# Patient Record
Sex: Male | Born: 1950 | Hispanic: No | Marital: Married | Smoking: Former smoker
Health system: Southern US, Community
[De-identification: ages and names within clinical notes are randomized; demographics above are authoritative.]

## PROBLEM LIST (undated history)

## (undated) DIAGNOSIS — E785 Hyperlipidemia, unspecified: Secondary | ICD-10-CM

## (undated) DIAGNOSIS — E1165 Type 2 diabetes mellitus with hyperglycemia: Secondary | ICD-10-CM

## (undated) DIAGNOSIS — I219 Acute myocardial infarction, unspecified: Secondary | ICD-10-CM

## (undated) DIAGNOSIS — E538 Deficiency of other specified B group vitamins: Secondary | ICD-10-CM

## (undated) DIAGNOSIS — E559 Vitamin D deficiency, unspecified: Secondary | ICD-10-CM

## (undated) DIAGNOSIS — N400 Enlarged prostate without lower urinary tract symptoms: Secondary | ICD-10-CM

## (undated) DIAGNOSIS — I10 Essential (primary) hypertension: Secondary | ICD-10-CM

## (undated) DIAGNOSIS — I2510 Atherosclerotic heart disease of native coronary artery without angina pectoris: Secondary | ICD-10-CM

## (undated) HISTORY — PX: CORONARY ANGIOPLASTY WITH STENT PLACEMENT: SHX49

## (undated) HISTORY — DX: Essential (primary) hypertension: I10

## (undated) HISTORY — DX: Hyperlipidemia, unspecified: E78.5

## (undated) HISTORY — DX: Atherosclerotic heart disease of native coronary artery without angina pectoris: I25.10

## (undated) HISTORY — PX: OTHER SURGICAL HISTORY: SHX169

## (undated) HISTORY — DX: Deficiency of other specified B group vitamins: E53.8

## (undated) HISTORY — DX: Vitamin D deficiency, unspecified: E55.9

## (undated) HISTORY — DX: Acute myocardial infarction, unspecified: I21.9

## (undated) HISTORY — DX: Type 2 diabetes mellitus with hyperglycemia: E11.65

## (undated) HISTORY — DX: Benign prostatic hyperplasia without lower urinary tract symptoms: N40.0

---

## 2018-11-07 ENCOUNTER — Encounter: Payer: Self-pay | Admitting: Gastroenterology

## 2018-11-09 ENCOUNTER — Encounter: Payer: Self-pay | Admitting: Gastroenterology

## 2018-11-21 ENCOUNTER — Encounter: Payer: Self-pay | Admitting: Gastroenterology

## 2018-12-05 ENCOUNTER — Ambulatory Visit: Payer: Medicaid Other | Admitting: Gastroenterology

## 2018-12-05 ENCOUNTER — Encounter: Payer: Self-pay | Admitting: Gastroenterology

## 2018-12-05 ENCOUNTER — Other Ambulatory Visit: Payer: Self-pay

## 2018-12-05 VITALS — BP 124/80 | HR 85 | Temp 98.0°F | Ht 64.0 in | Wt 158.0 lb

## 2018-12-05 DIAGNOSIS — R634 Abnormal weight loss: Secondary | ICD-10-CM | POA: Diagnosis not present

## 2018-12-05 DIAGNOSIS — R1084 Generalized abdominal pain: Secondary | ICD-10-CM | POA: Diagnosis not present

## 2018-12-05 DIAGNOSIS — K5900 Constipation, unspecified: Secondary | ICD-10-CM

## 2018-12-05 DIAGNOSIS — R630 Anorexia: Secondary | ICD-10-CM | POA: Diagnosis not present

## 2018-12-05 MED ORDER — NA SULFATE-K SULFATE-MG SULF 17.5-3.13-1.6 GM/177ML PO SOLN: 1.0000 | Freq: Once | ORAL | 0 refills | Status: AC

## 2018-12-05 NOTE — Patient Instructions (Addendum)
If you are age 70 or older, your body mass index should be between 23-30. Your Body mass index is 27.12 kg/m. If this is out of the aforementioned range listed, please consider follow up with your Primary Care Provider.  If you are age 79 or younger, your body mass index should be between 19-25. Your Body mass index is 27.12 kg/m. If this is out of the aformentioned range listed, please consider follow up with your Primary Care Provider.   To help prevent the possible spread of infection to our patients, communities, and staff; we will be implementing the following measures:  As of now we are not allowing any visitors/family members to accompany you to any upcoming appointments with [facility]. If you have any concerns about this please contact our office to discuss prior to the appointment.   You have been scheduled [facility] CT [facility] the [facility] [place] [address] [address] [facility]   You are scheduled on 12/07/2018 at 10:00am. You should arrive 15 minutes prior to your appointment time for registration. Please follow the written instructions below on the day of your exam:  WARNING: IF YOU ARE ALLERGIC TO IODINE/X-RAY DYE, PLEASE NOTIFY RADIOLOGY IMMEDIATELY AT [phone]! YOU WILL BE GIVEN A 13 HOUR PREMEDICATION PREP.  1) Do not eat or drink anything after 6:00am (4 hours prior to your test) 2) You have been given 2 bottles of oral contrast to drink. The solution may taste better if refrigerated, but do NOT add ice or any other liquid to this solution. Shake well before drinking.    Drink 1 bottle of contrast @ 8:00am (2 hours prior to your exam)  Drink 1 bottle of contrast @ 9:00am (1 hour prior to your exam)  You may take any medications as prescribed with a small amount of water, if necessary. If you take any of the following medications: METFORMIN, GLUCOPHAGE, GLUCOVANCE, AVANDAMET, RIOMET, FORTAMET, [place]  MET, JANUMET, GLUMETZA or METAGLIP, you MAY be asked to HOLD this medication 48 hours AFTER the exam.  The purpose of you drinking the oral contrast is to aid in the visualization of your intestinal tract. The contrast solution may cause some diarrhea. Depending on your individual set of symptoms, you may also receive an intravenous injection of x-ray contrast/dye. Plan on being at [facility] for 30 minutes or longer, depending on the type of exam you are having performed.  This test typically takes 30-45 minutes to complete.  If you have any questions regarding your exam or if you need to reschedule, you may call the CT department at [phone] between the hours of 8:00 am and 5:00 pm, Monday-Friday.  ________________________________________________________________________  Ralph Mosley have been scheduled for an endoscopy and colonoscopy. Please follow the written instructions given to you at your visit today. Please pick up your prep supplies at the pharmacy within the next 1-3 days. If you use inhalers (even only as needed), please bring them with you on the day of your procedure. Your physician has requested that you go to www.startemmi.com and enter the access code given to you at your visit today. This web site gives a general overview about your procedure. However, you should still follow specific instructions given to you by our office regarding your preparation for the procedure.  We have sent the following medications to your pharmacy for you to pick up at your convenience: Suprep  Please purchase the following medications over the  counter and take as directed: Miralax 17g mixed with 8 ox of water daily.  Thank you,  Dr. Jackquline Denmark

## 2018-12-05 NOTE — Progress Notes (Signed)
Chief Complaint: Abdominal pain  Referring Provider:  Dr Jannette Fogo      ASSESSMENT AND PLAN;   #1. Abd pain #2. Anorexia.  Has worsening CKD. Being followed by Dr Audie Clear. R/O GI causes.  Could also be because of medications. #3. Wt Loss #4. Constipation. #5.  Comorbid conditions include CAD, DM2, CKD3, HTN, HLD,   Plan: - Blood tests results from Dr Jannette Fogo. - CT abdomen/pelvis with PO contrast only (No IV contrast d/t CRI). - Proceed with EGD/colon off plavix 5 days after cardio clearance at [facility]. Discussed risks & benefits. Patient and patient's son agrees to proceed. All the questions were answered. Consent forms given for review.  It will be a good opportunity to screen him for colorectal cancer screenings as well. - If continues to have upper GI symptoms and the above work-up is negative, proceed with solid-phase GES to r/o diabetic gastroparesis. - Miralax 17g po qd for now. - Can stop Linzess as it is not working. - For now we will continue Protonix 40 mg p.o. once a day.  May reduce dose after EGD. - FU therafter. - D/W patient and patient's son in detail.    HPI:    Ralph Mosley is a 68 y.o. male  Accompanied by his son With longstanding history of chronic abdominal pain x several years, getting worse lately.  More or less generalized.  More with fried foods. Has associated mild nausea and more profound anorexia with questionable early satiety.  The GI symptoms are getting worse.  No odynophagia or dysphagia.  Has history of longstanding constipation with passage of pellet-like stools.  At times has to strain.  Associated with abdominal bloating.  Apparently had CT scan several years ago which showed gastric wall thickening.  Has lost approximately 10 pounds over the last 3 months.  No weight loss.  Also history of anemia in the past.  Had blood work done by Dr. Jannette Fogo few days ago.  I do not have the lab results at the present time.   Past Medical History:   Diagnosis Date  . CAD (coronary artery disease)   . HTN (hypertension)   . Hyperlipidemia   . Hyperplasia of prostate without lower urinary tract symptoms (LUTS)   . Type 2 diabetes mellitus with hyperglycemia ([place])   . Vitamin B12 deficiency   . Vitamin D deficiency     Past Surgical History:  Procedure Laterality Date  . Cardiac Catherization    . CORONARY ANGIOPLASTY WITH STENT PLACEMENT      Family History  Problem Relation Age of Onset  . Colon cancer Neg Hx   . Esophageal cancer Neg Hx     Social History   Tobacco Use  . Smoking status: Former Research scientist (life sciences)  . Smokeless tobacco: Never Used  Substance Use Topics  . Alcohol use: Not Currently  . Drug use: Not Currently    Current Outpatient Medications  Medication Sig Dispense Refill  . clopidogrel (PLAVIX) 75 MG tablet Take 75 mg by mouth daily.    Marland Kitchen glipiZIDE (GLUCOTROL XL) 10 MG 24 hr tablet Take 1 tablet by mouth daily.    Marland Kitchen linaclotide (LINZESS) 145 MCG CAPS capsule Take 145 mcg by mouth as needed.    . nitroGLYCERIN (NITROSTAT) 0.4 MG SL tablet Place under the tongue as needed.    . OSENI 25-15 MG TABS Take 1 tablet by mouth daily.    . rosuvastatin (CRESTOR) 20 MG tablet Take 20 mg by mouth daily.    Marland Kitchen  TRULICITY [id number] SOPN once a week.    . Vitamin D, Ergocalciferol, (DRISDOL) 1.25 MG (50000 UT) CAPS capsule 1 capsule. 2 times a week    . omeprazole (PRILOSEC) 20 MG capsule Take by mouth daily.     No current facility-administered medications for this visit.     Not on File  Review of Systems:  Constitutional: Denies fever, chills, diaphoresis, appetite change and fatigue.  HEENT: Denies photophobia, eye pain, redness, hearing loss, ear pain, congestion, sore throat, rhinorrhea, sneezing, mouth sores, neck pain, neck stiffness and tinnitus.   Respiratory: Denies SOB, DOE, cough, chest tightness,  and wheezing.   Cardiovascular: Denies chest pain, palpitations and leg swelling.  Genitourinary: Denies  dysuria, urgency, frequency, hematuria, flank pain and difficulty urinating.  Musculoskeletal: Denies myalgias, back pain, joint swelling, arthralgias and gait problem.  Skin: No rash.  Neurological: Denies dizziness, seizures, syncope, weakness, light-headedness, numbness and headaches.  Hematological: Denies adenopathy. Easy bruising, personal or family bleeding history  Psychiatric/Behavioral: No anxiety or depression     Physical Exam:    BP 124/80   Pulse 85   Temp 98 F (36.7 C)   Ht 5\' 4"  (1.626 m)   Wt 158 lb (71.7 kg)   BMI 27.12 kg/m  Filed Weights   12/05/18 1105  Weight: 158 lb (71.7 kg)   Constitutional:  Well-developed, in no acute distress. Psychiatric: Normal mood and affect. Behavior is normal. HEENT: Pupils normal.  Conjunctivae are normal. No scleral icterus. Neck supple.  Cardiovascular: Normal rate, regular rhythm. No edema Pulmonary/chest: Effort normal and breath sounds normal. No wheezing, rales or rhonchi. Abdominal: Soft, nondistended. Nontender. Bowel sounds active throughout. There are no masses palpable. No hepatomegaly. Rectal:  defered Neurological: Alert and oriented to person place and time. Skin: Skin is warm and dry. No rashes noted.    Carmell Austria, MD 12/05/2018, 11:29 AM  Cc: Dr Jannette Fogo

## 2018-12-06 ENCOUNTER — Telehealth: Payer: Self-pay

## 2018-12-06 NOTE — Telephone Encounter (Signed)
Called and told patient patient that we received cardiac clearance and to stop Plavix 5 days prior to his procedure. Patient verbalized understanding

## 2018-12-07 ENCOUNTER — Other Ambulatory Visit: Payer: Self-pay

## 2018-12-07 ENCOUNTER — Ambulatory Visit (HOSPITAL_BASED_OUTPATIENT_CLINIC_OR_DEPARTMENT_OTHER)
Admission: RE | Admit: 2018-12-07 | Discharge: 2018-12-07 | Disposition: A | Discharge status: Home / Self Care | Payer: Medicaid Other | Source: Ambulatory Visit | Attending: Gastroenterology | Admitting: Gastroenterology

## 2018-12-07 DIAGNOSIS — R1084 Generalized abdominal pain: Secondary | ICD-10-CM | POA: Diagnosis not present

## 2018-12-07 DIAGNOSIS — R630 Anorexia: Secondary | ICD-10-CM | POA: Insufficient documentation

## 2018-12-07 DIAGNOSIS — K5900 Constipation, unspecified: Secondary | ICD-10-CM | POA: Diagnosis present

## 2018-12-07 DIAGNOSIS — R634 Abnormal weight loss: Secondary | ICD-10-CM | POA: Insufficient documentation

## 2019-01-04 ENCOUNTER — Telehealth: Payer: Self-pay | Admitting: Gastroenterology

## 2019-01-04 NOTE — Telephone Encounter (Signed)
Spoke to patient, answered NO to all Covid questions

## 2019-01-05 ENCOUNTER — Other Ambulatory Visit: Payer: Self-pay

## 2019-01-05 ENCOUNTER — Ambulatory Visit (AMBULATORY_SURGERY_CENTER): Payer: Medicaid Other | Admitting: Gastroenterology

## 2019-01-05 ENCOUNTER — Encounter: Payer: Self-pay | Admitting: Gastroenterology

## 2019-01-05 VITALS — BP 109/51 | HR 80 | Temp 97.6°F | Resp 12 | Ht 64.0 in | Wt 158.0 lb

## 2019-01-05 DIAGNOSIS — R630 Anorexia: Secondary | ICD-10-CM

## 2019-01-05 DIAGNOSIS — K3189 Other diseases of stomach and duodenum: Secondary | ICD-10-CM | POA: Diagnosis not present

## 2019-01-05 DIAGNOSIS — B9681 Helicobacter pylori [H. pylori] as the cause of diseases classified elsewhere: Secondary | ICD-10-CM

## 2019-01-05 DIAGNOSIS — R1084 Generalized abdominal pain: Secondary | ICD-10-CM

## 2019-01-05 DIAGNOSIS — D123 Benign neoplasm of transverse colon: Secondary | ICD-10-CM

## 2019-01-05 DIAGNOSIS — D122 Benign neoplasm of ascending colon: Secondary | ICD-10-CM | POA: Diagnosis not present

## 2019-01-05 DIAGNOSIS — K5730 Diverticulosis of large intestine without perforation or abscess without bleeding: Secondary | ICD-10-CM

## 2019-01-05 DIAGNOSIS — K648 Other hemorrhoids: Secondary | ICD-10-CM | POA: Diagnosis not present

## 2019-01-05 DIAGNOSIS — K269 Duodenal ulcer, unspecified as acute or chronic, without hemorrhage or perforation: Secondary | ICD-10-CM

## 2019-01-05 DIAGNOSIS — K2970 Gastritis, unspecified, without bleeding: Secondary | ICD-10-CM

## 2019-01-05 DIAGNOSIS — R634 Abnormal weight loss: Secondary | ICD-10-CM

## 2019-01-05 DIAGNOSIS — K259 Gastric ulcer, unspecified as acute or chronic, without hemorrhage or perforation: Secondary | ICD-10-CM | POA: Diagnosis not present

## 2019-01-05 MED ORDER — FAMOTIDINE 20 MG PO TABS: 20.0000 mg | ORAL_TABLET | Freq: Every evening | ORAL | 2 refills | Status: AC

## 2019-01-05 MED ORDER — SODIUM CHLORIDE 0.9 % IV SOLN: 500.0000 mL | INTRAVENOUS | Status: DC

## 2019-01-05 MED ORDER — PANTOPRAZOLE SODIUM 40 MG PO TBEC: 40.0000 mg | DELAYED_RELEASE_TABLET | Freq: Every day | ORAL | 3 refills | Status: AC

## 2019-01-05 NOTE — Patient Instructions (Signed)
Impression/Recommendations:  Gastritis handout given to patient. Diverticulosis handout given to patient. Hemorrhoid handout given to patient.  Resume previous diet. Continue present medications.  Increase Protonix 40 mg. By mouth 2 times daily for 6 weeks, then resume once a day.  Add Pepcid 20 mg. At night.  Resume Plavix (clopidogrel) at prior does in 7 days.  (October 3rd)  No aspirin, ibuprofen, naproxen, or other NSAID drugs.  Recommend repeating EGD in 12 weeks off Plavix.  Follow-up in GI clinic in 6-8 weeks.  YOU HAD AN ENDOSCOPIC PROCEDURE TODAY AT [facility]:   Refer to the procedure report that was given to you for any specific questions about what was found during the examination.  If the procedure report does not answer your questions, please call your gastroenterologist to clarify.  If you requested that your care partner not be given the details of your procedure findings, then the procedure report has been included in a sealed envelope for you to review at your convenience later.  YOU SHOULD EXPECT: Some feelings of bloating in the abdomen. Passage of more gas than usual.  Walking can help get rid of the air that was put into your GI tract during the procedure and reduce the bloating. If you had a lower endoscopy (such as a colonoscopy or flexible sigmoidoscopy) you may notice spotting of blood in your stool or on the toilet paper. If you underwent a bowel prep for your procedure, you may not have a normal bowel movement for a few days.  Please Note:  You might notice some irritation and congestion in your nose or some drainage.  This is from the oxygen used during your procedure.  There is no need for concern and it should clear up in a day or so.  SYMPTOMS TO REPORT IMMEDIATELY:   Following lower endoscopy (colonoscopy or flexible sigmoidoscopy):  Excessive amounts of blood in the stool  Significant tenderness or worsening of abdominal  pains  Swelling of the abdomen that is new, acute  Fever of 100F or higher   Following upper endoscopy (EGD)  Vomiting of blood or coffee ground material  New chest pain or pain under the shoulder blades  Painful or persistently difficult swallowing  New shortness of breath  Fever of 100F or higher  Black, tarry-looking stools  For urgent or emergent issues, a gastroenterologist can be reached at any hour by calling [phone].   DIET:  We do recommend a small meal at first, but then you may proceed to your regular diet.  Drink plenty of fluids but you should avoid alcoholic beverages for 24 hours.  ACTIVITY:  You should plan to take it easy for the rest of today and you should NOT DRIVE or use heavy machinery until tomorrow (because of the sedation medicines used during the test).    FOLLOW UP: Our staff will call the number listed on your records 48-72 hours following your procedure to check on you and address any questions or concerns that you may have regarding the information given to you following your procedure. If we do not reach you, we will leave a message.  We will attempt to reach you two times.  During this call, we will ask if you have developed any symptoms of COVID 19. If you develop any symptoms (ie: fever, flu-like symptoms, shortness of breath, cough etc.) before then, please call [phone].  If you test positive for Covid 19 in the 2 weeks post procedure, please call  and report this information to [place].    If any biopsies were taken you will be contacted by phone or by letter within the next 1-3 weeks.  Please call us at [phone] if you have not heard about the biopsies in 3 weeks.    SIGNATURES/CONFIDENTIALITY: You and/or your care partner have signed paperwork which will be entered into your electronic medical record.  These signatures attest to the fact that that the information above on your After Visit Summary has been reviewed and is understood.   Full responsibility of the confidentiality of this discharge information lies with you and/or your care-partner.

## 2019-01-05 NOTE — Progress Notes (Signed)
PT taken to PACU. Monitors in place. VSS. Report given to RN. 

## 2019-01-05 NOTE — Op Note (Addendum)
Ralph Mosley Patient Name: Ralph Mosley Procedure Date: 01/05/2019 8:02 AM MRN: [id number] Endoscopist: Jackquline Denmark , MD Age: 68 Referring MD:  Date of Birth: [birth year] Gender: Male Account #: [id number] Procedure:                Colonoscopy Indications:              Iron deficiency anemia Medicines:                Monitored Anesthesia Care Procedure:                Pre-Anesthesia Assessment:                           - Prior to the procedure, a History and Physical                            was performed, and patient medications and                            allergies were reviewed. The patient's tolerance of                            previous anesthesia was also reviewed. The risks                            and benefits of the procedure and the sedation                            options and risks were discussed with the patient.                            All questions were answered, and informed consent                            was obtained. Prior Anticoagulants: The patient has                            taken Plavix (clopidogrel), last dose was 5 days                            prior to procedure. ASA Grade Assessment: III - A                            patient with severe systemic disease. After                            reviewing the risks and benefits, the patient was                            deemed in satisfactory condition to undergo the                            procedure.  After obtaining informed consent, the colonoscope                            was passed under direct vision. Throughout the                            procedure, the patient's blood pressure, pulse, and                            oxygen saturations were monitored continuously. The                            LOANER 0255 was introduced through the anus and                            advanced to the 1 cm into the ileum. The   colonoscopy was performed without difficulty. The                            patient tolerated the procedure well. The quality                            of the bowel preparation was good. The ileocecal                            valve, appendiceal orifice, and rectum were                            photographed. Scope In: 8:16:04 AM Scope Out: 8:25:13 AM Scope Withdrawal Time: 0 hours 5 minutes 57 seconds  Total Procedure Duration: 0 hours 9 minutes 9 seconds  Findings:                 Three sessile polyps were found in the transverse                            colon and ascending colon. The polyps were 2 to 8                            mm in size. Larger polyps were removed with a cold                            snare, small one with cold biopsy forceps.                            Resection and retrieval were complete. Estimated                            blood loss: none.                           A few small-mouthed diverticula were found in the  sigmoid colon.                           Non-bleeding internal hemorrhoids were found during                            retroflexion. The hemorrhoids were moderate.                           The terminal ileum appeared normal.                           The exam was otherwise without abnormality. Complications:            No immediate complications. Estimated Blood Loss:     Estimated blood loss: none. Impression:               -Colonic polyps S/P polypectomy.                           -Minimal sigmoid diverticulosis.                           -Non-bleeding internal hemorrhoids.                           -Otherwise normal colonoscopy to TI. Recommendation:           - Patient has a contact number available for                            emergencies. The signs and symptoms of potential                            delayed complications were discussed with the                            patient. Return to normal  activities tomorrow.                            Written discharge instructions were provided to the                            patient.                           - Resume previous diet.                           - Continue present medications.                           - MiraLAX 17 g p.o. once a day if he gets                            constipated.                           - Resume Plavix (clopidogrel)  at prior dose as per                            EGD note.                           - Await pathology results.                           - Repeat colonoscopy for surveillance based on                            pathology results.                           - Return to GI clinic in 8 weeks. Jackquline Denmark, MD 01/05/2019 8:39:51 AM This report has been signed electronically.

## 2019-01-05 NOTE — Progress Notes (Signed)
Pt's states no medical or surgical changes since  office visit. 

## 2019-01-05 NOTE — Op Note (Signed)
[place] Patient Name: Ralph Mosley Procedure Date: 01/05/2019 8:02 AM MRN: [id number] Endoscopist: Jackquline Denmark , MD Age: 68 Referring MD:  Date of Birth: Apr 30, 1950 Gender: Male Account #: [id number] Procedure:                Upper GI endoscopy Indications:              Epigastric abdominal pain, anorexia Medicines:                Monitored Anesthesia Care Procedure:                Pre-Anesthesia Assessment:                           - Prior to the procedure, a History and Physical                            was performed, and patient medications and                            allergies were reviewed. The patient's tolerance of                            previous anesthesia was also reviewed. The risks                            and benefits of the procedure and the sedation                            options and risks were discussed with the patient.                            All questions were answered, and informed consent                            was obtained. Prior Anticoagulants: The patient has                            taken Plavix (clopidogrel), last dose was 5 days                            prior to procedure. ASA Grade Assessment: III - A                            patient with severe systemic disease. After                            reviewing the risks and benefits, the patient was                            deemed in satisfactory condition to undergo the                            procedure.  After obtaining informed consent, the endoscope was                            passed under direct vision. Throughout the                            procedure, the patient's blood pressure, pulse, and                            oxygen saturations were monitored continuously. The                            Endoscope was introduced through the mouth, and                            advanced to the second part of duodenum. The upper                        GI endoscopy was accomplished without difficulty.                            The patient tolerated the procedure well. Scope In: Scope Out: Findings:                 The examined esophagus was normal.                           The Z-line was regular and was found 35 cm from the                            incisors.                           Scattered moderate inflammation characterized by                            erosions was found in the gastric body and antrum.                            3 superficial ulcers were noted in the body of the                            stomach along the lesser curvature. The mucosa was                            friable and would bleed easily to touch. Biopsies                            were taken with a cold forceps for histology.                           2 small superficial duodenal ulcers were noted                            measuring  6 to 8 mm. The second portion of the                            duodenum was normal. Biopsies for histology were                            taken with a cold forceps for evaluation of celiac                            disease. Complications:            No immediate complications. Estimated Blood Loss:     Estimated blood loss: none. Impression:               -Gastric ulcers with moderate gastritis.                           -Duodenal ulcers. Recommendation:           - Patient has a contact number available for                            emergencies. The signs and symptoms of potential                            delayed complications were discussed with the                            patient. Return to normal activities tomorrow.                            Written discharge instructions were provided to the                            patient.                           - Resume previous diet.                           - Continue present medications.                           - Follow biopsies.                            - Increase Protonix 40 mg p.o. twice daily x 6                            weeks, then resume once a day.                           - Add Pepcid 20 mg p.o. nightly, 30, 2 reffils                           - Resume Plavix (clopidogrel) at prior dose in 7  days.                           - No aspirin, ibuprofen, naproxen, or other                            non-steroidal anti-inflammatory drugs.                           - Recommend repeating EGD in 12 weeks off Plavix.                           - Follow-up in the GI clinic in 6 to 8 weeks. Jackquline Denmark, MD 01/05/2019 8:35:02 AM This report has been signed electronically.

## 2019-01-08 ENCOUNTER — Telehealth: Payer: Self-pay

## 2019-01-08 NOTE — Telephone Encounter (Signed)
  Follow up Call-  Call back number 01/05/2019  Post procedure Call Back phone  # [phone]  Permission to leave phone message Yes     Patient questions:  Do you have a fever, pain , or abdominal swelling? No. Pain Score  0 *  Have you tolerated food without any problems? Yes.    Have you been able to return to your normal activities? Yes.    Do you have any questions about your discharge instructions: Diet   No. Medications  No. Follow up visit  No.  Do you have questions or concerns about your Care? No.  Actions: * If pain score is 4 or above: No action needed, pain <4.  1. Have you developed a fever since your procedure? no  2.   Have you had an respiratory symptoms (SOB or cough) since your procedure? no  3.   Have you tested positive for COVID 19 since your procedure no  4.   Have you had any family members/close contacts diagnosed with the COVID 19 since your procedure?  no   If yes to any of these questions please route to Joylene John, RN and Alphonsa Gin, Therapist, sports.

## 2019-01-13 ENCOUNTER — Encounter: Payer: Self-pay | Admitting: Gastroenterology

## 2019-01-14 ENCOUNTER — Other Ambulatory Visit: Payer: Self-pay

## 2019-01-14 DIAGNOSIS — A048 Other specified bacterial intestinal infections: Secondary | ICD-10-CM

## 2019-01-14 DIAGNOSIS — K50919 Crohn's disease, unspecified, with unspecified complications: Secondary | ICD-10-CM

## 2019-01-14 DIAGNOSIS — R197 Diarrhea, unspecified: Secondary | ICD-10-CM

## 2019-01-14 DIAGNOSIS — D509 Iron deficiency anemia, unspecified: Secondary | ICD-10-CM

## 2019-01-14 MED ORDER — CLARITHROMYCIN 500 MG PO TABS: 500.0000 mg | ORAL_TABLET | Freq: Two times a day (BID) | ORAL | 0 refills | Status: AC

## 2019-01-14 MED ORDER — METRONIDAZOLE 500 MG PO TABS: 500.0000 mg | ORAL_TABLET | Freq: Two times a day (BID) | ORAL | 0 refills | Status: AC

## 2019-01-14 MED ORDER — PANTOPRAZOLE SODIUM 40 MG PO TBEC: 40.0000 mg | DELAYED_RELEASE_TABLET | Freq: Two times a day (BID) | ORAL | 0 refills | Status: AC

## 2019-01-14 MED ORDER — AMOXICILLIN 500 MG PO TABS: 1000.0000 mg | ORAL_TABLET | Freq: Two times a day (BID) | ORAL | 0 refills | Status: AC

## 2019-01-14 NOTE — Addendum Note (Signed)
Addended by: Mohammed Kindle on: 01/14/2019 03:34 PM   Modules accepted: Orders

## 2021-08-05 IMAGING — CT CT ABDOMEN AND PELVIS WITHOUT CONTRAST
2 of 4 series · 17 of 46 positions shown, 19 images · non-contrast
Comparison: None.

CLINICAL DATA: Loss of appetite for 2-3 months. Diabetic.
Constipation.

EXAM:
CT ABDOMEN AND PELVIS WITHOUT CONTRAST
TECHNIQUE: Multidetector CT imaging of the abdomen and pelvis was performed
following the standard protocol without IV contrast.

[Series 2: axial st · axial · 0.83mm/px · z∈[-388,+32]mm · 14 of 92 slices shown, 16 images]
[im 4/92  soft-tissue]
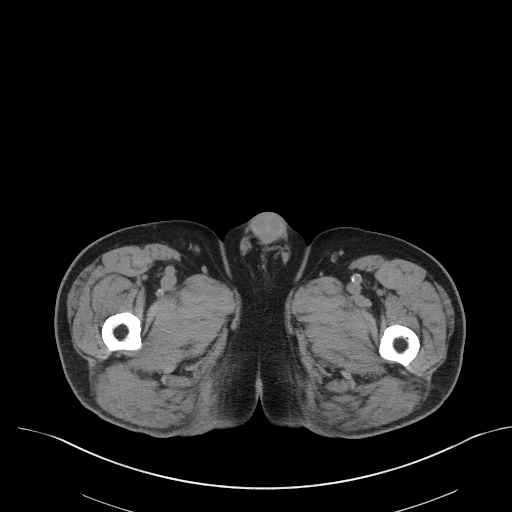
[im 4/92  bone]
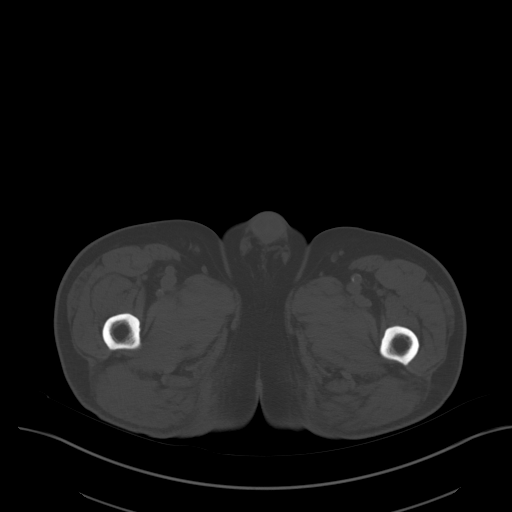
[im 12/92  soft-tissue]
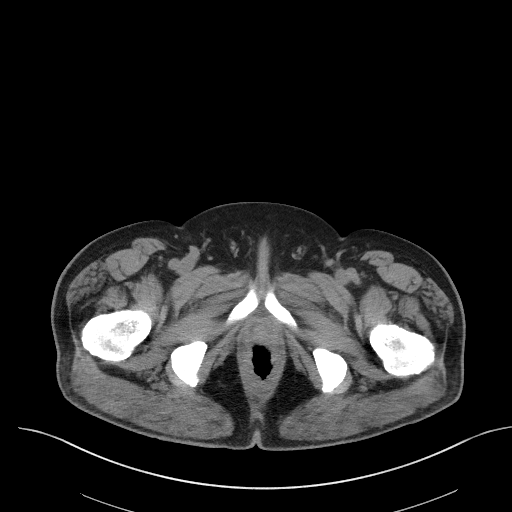
[im 19/92  soft-tissue]
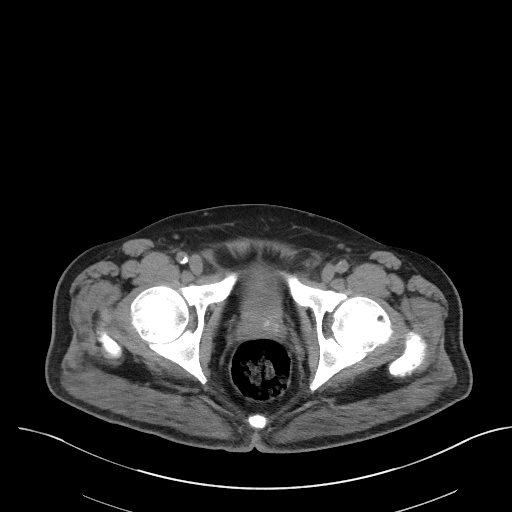
[im 23/92  soft-tissue]
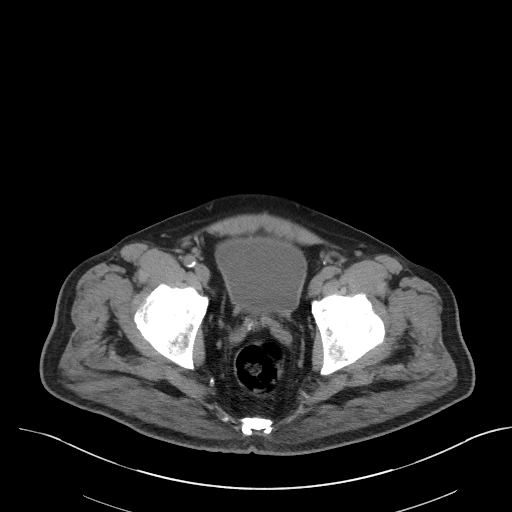
[im 31/92  soft-tissue]
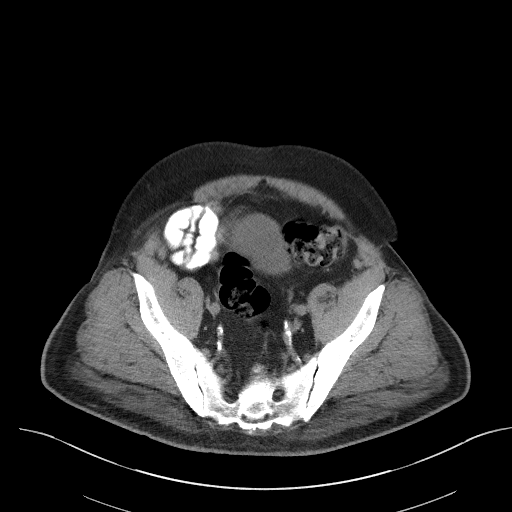
[im 38/92  soft-tissue]
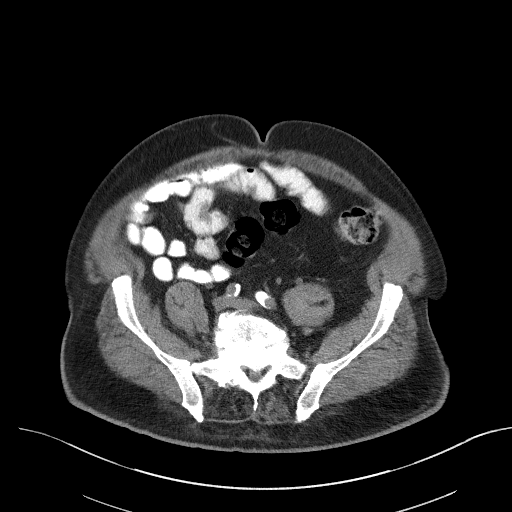
[im 42/92  soft-tissue]
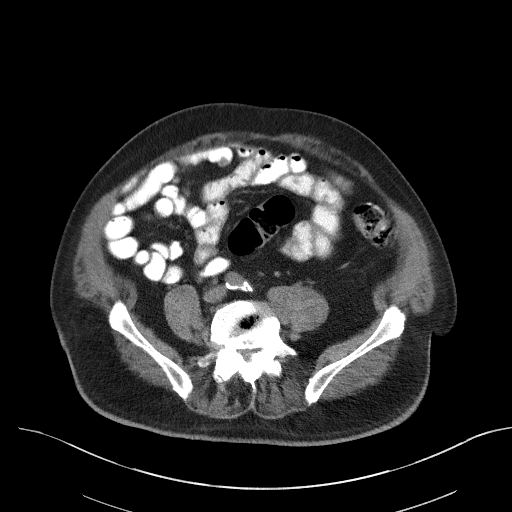
[im 50/92  soft-tissue]
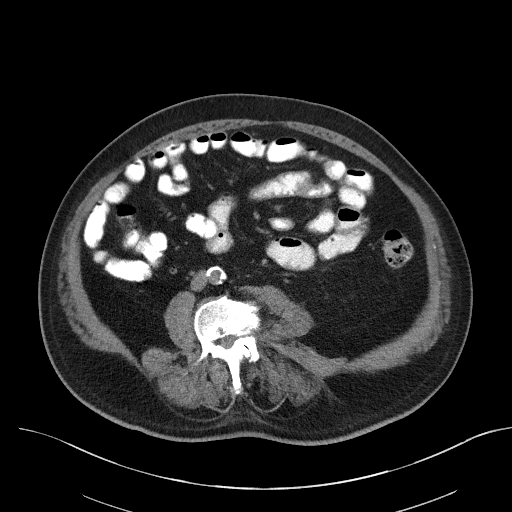
[im 54/92  soft-tissue]
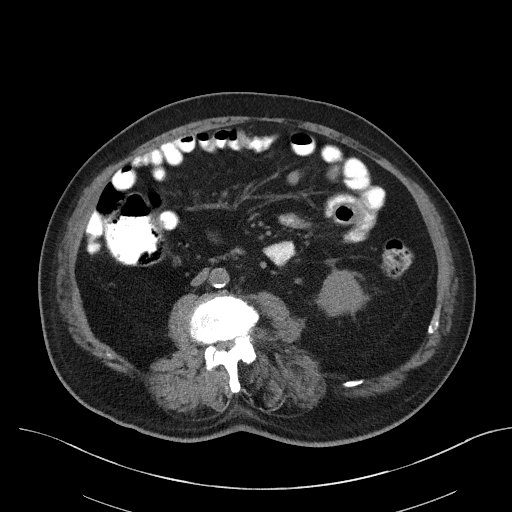
[im 54/92  bone]
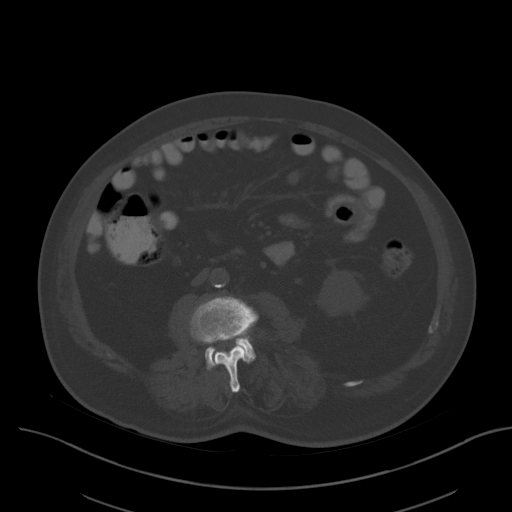
[im 61/92  soft-tissue]
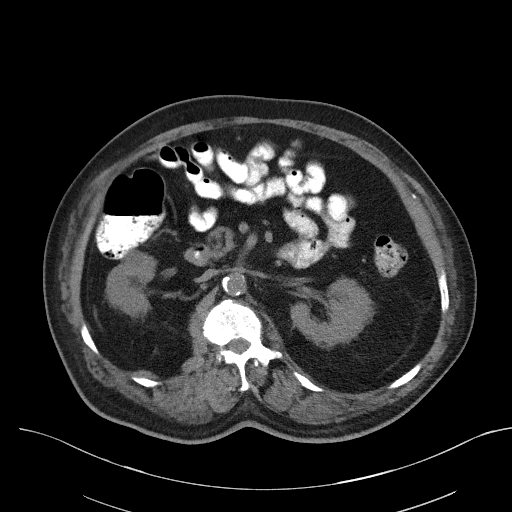
[im 69/92  soft-tissue]
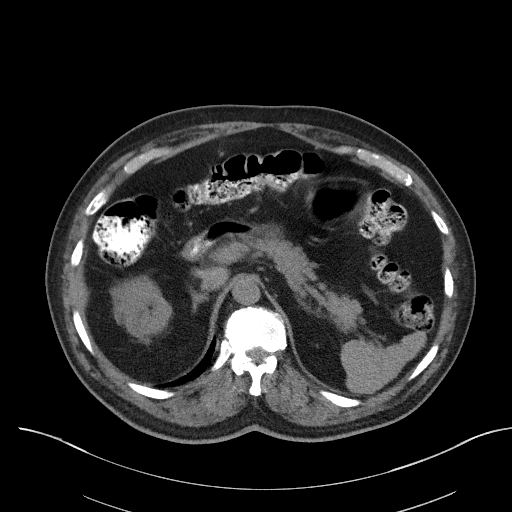
[im 73/92  soft-tissue]
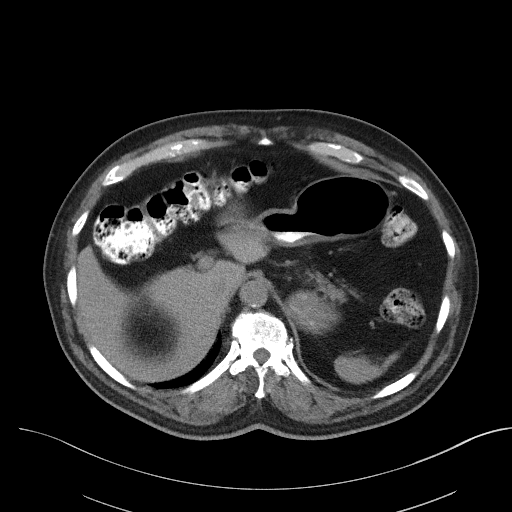
[im 80/92  soft-tissue]
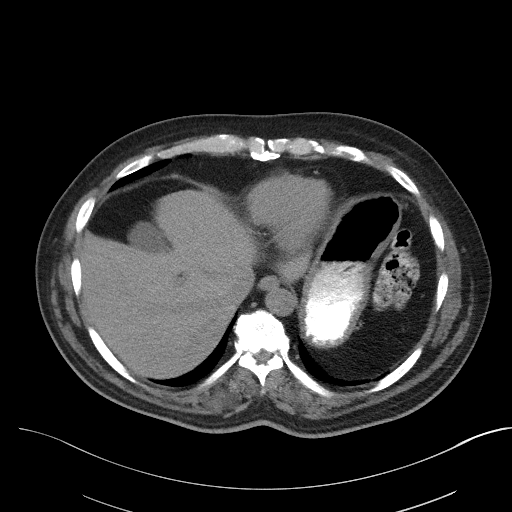
[im 88/92  soft-tissue]
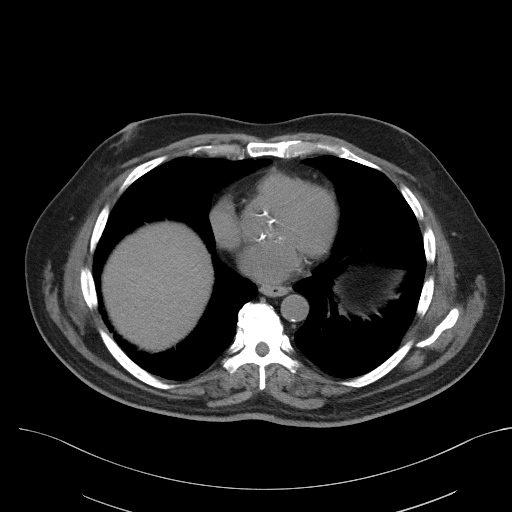

[Series 5: coronal st · coronal · 0.83mm/px · 3 of 102 slices shown]
[im 34/102  soft-tissue]
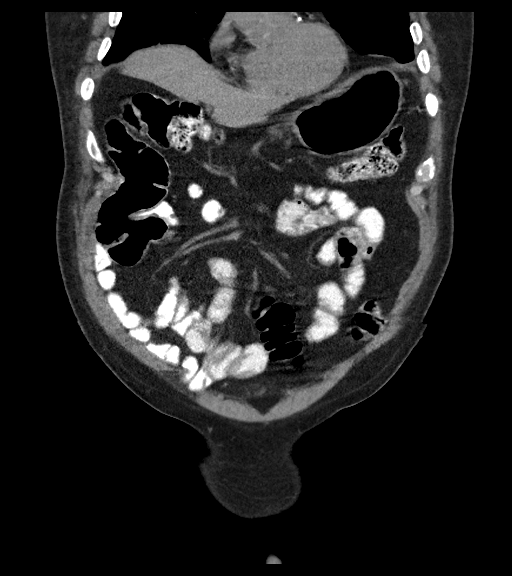
[im 45/102  soft-tissue]
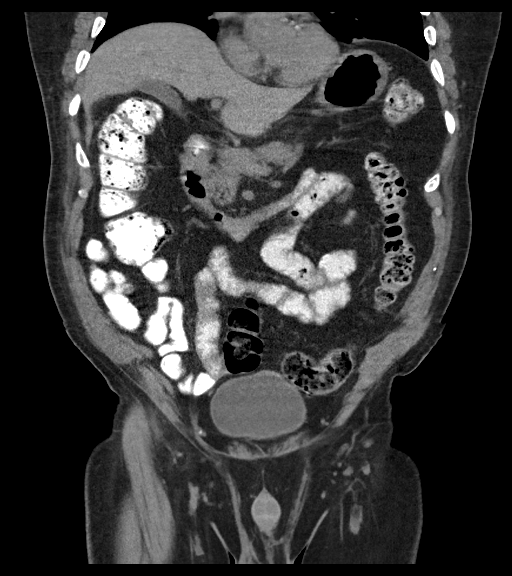
[im 57/102  soft-tissue]
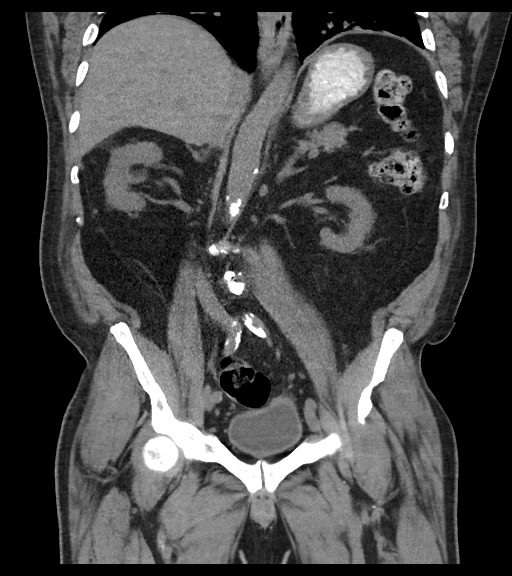

[17 of 46 positions shown; findings below may reference images not displayed]

FINDINGS: Lower chest: Bibasilar volume loss and subsegmental atelectasis or
scar. Normal heart size without pericardial or pleural effusion.
Multivessel coronary artery atherosclerosis.

Hepatobiliary: Normal liver. Normal gallbladder, without biliary
ductal dilatation.

Pancreas: Normal, without mass or ductal dilatation.

Spleen: Normal in size, without focal abnormality.

Adrenals/Urinary Tract: Normal adrenal glands. Mild renal cortical
thinning bilaterally. No renal calculi or hydronephrosis. No
hydroureter or ureteric calculi. No bladder calculi.

Stomach/Bowel: Normal stomach, without wall thickening. Moderate
stool within the rectum. Normal terminal ileum and appendix. Normal
small bowel.

Vascular/Lymphatic: Advanced aortic and branch vessel
atherosclerosis. No abdominopelvic adenopathy.

Reproductive: Normal prostate.

Other: No significant free fluid.

Musculoskeletal: Lumbosacral spondylosis. Convex right lumbar spine
curvature.
IMPRESSION: 1. No acute process in the abdomen or pelvis. No explanation for
patient's symptoms.
2. Possible constipation.
3. Coronary artery atherosclerosis.

Aortic Atherosclerosis ([id number]).

## 2023-08-16 ENCOUNTER — Other Ambulatory Visit: Payer: Self-pay | Admitting: Internal Medicine

## 2023-08-16 DIAGNOSIS — R519 Headache, unspecified: Secondary | ICD-10-CM

## 2023-09-03 ENCOUNTER — Ambulatory Visit
Admission: RE | Admit: 2023-09-03 | Discharge: 2023-09-03 | Disposition: A | Discharge status: Home / Self Care | Source: Ambulatory Visit | Attending: Internal Medicine | Admitting: Internal Medicine

## 2023-09-03 DIAGNOSIS — R519 Headache, unspecified: Secondary | ICD-10-CM

## 2024-03-09 ENCOUNTER — Encounter: Payer: Self-pay | Admitting: Gastroenterology
